# Patient Record
Sex: Female | Born: 1990 | Race: White | Hispanic: No | Marital: Single
Health system: Southern US, Community
[De-identification: ages and names within clinical notes are randomized; demographics above are authoritative.]

---

## 2014-07-30 DIAGNOSIS — F649 Gender identity disorder, unspecified: Secondary | ICD-10-CM | POA: Insufficient documentation

## 2017-07-14 DIAGNOSIS — F410 Panic disorder [episodic paroxysmal anxiety] without agoraphobia: Secondary | ICD-10-CM | POA: Insufficient documentation

## 2018-03-29 DIAGNOSIS — F79 Unspecified intellectual disabilities: Secondary | ICD-10-CM | POA: Insufficient documentation

## 2021-03-24 DIAGNOSIS — R9431 Abnormal electrocardiogram [ECG] [EKG]: Secondary | ICD-10-CM | POA: Insufficient documentation

## 2021-03-24 DIAGNOSIS — R0602 Shortness of breath: Secondary | ICD-10-CM | POA: Insufficient documentation

## 2021-03-24 DIAGNOSIS — R079 Chest pain, unspecified: Secondary | ICD-10-CM | POA: Insufficient documentation

## 2021-03-24 DIAGNOSIS — Z8709 Personal history of other diseases of the respiratory system: Secondary | ICD-10-CM | POA: Insufficient documentation

## 2021-08-07 ENCOUNTER — Ambulatory Visit (INDEPENDENT_AMBULATORY_CARE_PROVIDER_SITE_OTHER): Payer: Commercial Managed Care - PPO

## 2021-08-07 ENCOUNTER — Other Ambulatory Visit: Payer: Self-pay

## 2021-08-07 ENCOUNTER — Other Ambulatory Visit: Payer: Self-pay | Admitting: *Deleted

## 2021-08-07 ENCOUNTER — Ambulatory Visit: Payer: Commercial Managed Care - PPO | Admitting: Podiatry

## 2021-08-07 ENCOUNTER — Encounter: Payer: Self-pay | Admitting: Podiatry

## 2021-08-07 ENCOUNTER — Other Ambulatory Visit: Payer: Self-pay | Admitting: Podiatry

## 2021-08-07 DIAGNOSIS — M25571 Pain in right ankle and joints of right foot: Secondary | ICD-10-CM | POA: Diagnosis not present

## 2021-08-07 DIAGNOSIS — M778 Other enthesopathies, not elsewhere classified: Secondary | ICD-10-CM

## 2021-08-07 DIAGNOSIS — G8929 Other chronic pain: Secondary | ICD-10-CM | POA: Diagnosis not present

## 2021-08-07 DIAGNOSIS — M79671 Pain in right foot: Secondary | ICD-10-CM

## 2021-08-07 DIAGNOSIS — M25371 Other instability, right ankle: Secondary | ICD-10-CM

## 2021-08-12 NOTE — Progress Notes (Signed)
Subjective:   Patient ID: Nancy Levine, female   DOB: 30 y.o.   MRN: [id number]   HPI 30 year old female presents the office with her mom for concerns of right ankle, foot discomfort.  She has not been hurting for the last 2 months.  She has a previous history of having surgery for torn ligament in the ankle and she also states that she had a screw put in that was removed 6 months later as she was not able to tolerate this.  This was done in 2010.  She denies any recent injury or trauma.  She is using a cane due to the pain.   Review of Systems  All other systems reviewed and are negative.  History reviewed. No pertinent past medical history.  History reviewed. No pertinent surgical history.   Current Outpatient Medications:    albuterol (VENTOLIN HFA) 108 (90 Base) MCG/ACT inhaler, SMARTSIG:1-2 Puff(s) Via Inhaler Every 6 Hours PRN, Disp: , Rfl:    estradiol (ESTRACE) 2 MG tablet, Take 2 mg by mouth 2 (two) times daily., Disp: , Rfl:    spironolactone (ALDACTONE) 100 MG tablet, Take 100 mg by mouth 2 (two) times daily., Disp: , Rfl:   No Known Allergies        Objective:  Physical Exam  General: AAO x3, NAD  Dermatological: Skin is warm, dry and supple bilateral.  There are no open sores, no preulcerative lesions, no rash or signs of infection present.  Scar from prior surgery are well-healed.  Vascular: Dorsalis Pedis artery and Posterior Tibial artery pedal pulses are 2/4 bilateral with immedate capillary fill time.  There is no pain with calf compression, swelling, warmth, erythema.   Neruologic: Grossly intact via light touch bilateral.   Musculoskeletal: There is tenderness palpation of the lateral ankle complex mostly in the ATFL.  Slight increase in anterior drawer compared to contra lower extremity.  Mild discomfort on sinus tarsi.  Mild discomfort on the peroneal tendon but overall clinically it appears to be intact.  Muscular strength 5/5 in all groups tested  bilateral.  Gait: Unassisted, Nonantalgic.       Assessment:   Right ankle instability, pain     Plan:  -Treatment options discussed including all alternatives, risks, and complications -Etiology of symptoms were discussed -X-rays were obtained and reviewed with the patient.  No evidence of acute fracture or stress fracture identified today.  Awaiting radiology report. -This point given her history of surgical site instability and pain I recommend an MRI to further evaluate the lateral ankle complex as well as peroneal tendon.  This was ordered today.  This is for potential surgical planning.  Hopefully we can avoid surgery and discussed possibly starting physical therapy however I would like to wait the results of the MRI before proceeding more further treatment.  Vivi Barrack DPM

## 2021-08-13 ENCOUNTER — Telehealth: Payer: Self-pay | Admitting: *Deleted

## 2021-08-13 NOTE — Telephone Encounter (Signed)
Called and spoke with Nancy Levine from [facility] and the prior authorization was not needed for procedure code 49449 and the reference number is [id number] and called Fleet Contras from [facility] and Fleet Contras stated that she would call the patient. Misty Stanley

## 2021-08-23 ENCOUNTER — Ambulatory Visit (INDEPENDENT_AMBULATORY_CARE_PROVIDER_SITE_OTHER): Payer: Commercial Managed Care - PPO

## 2021-08-23 ENCOUNTER — Other Ambulatory Visit: Payer: Self-pay

## 2021-08-23 DIAGNOSIS — M25571 Pain in right ankle and joints of right foot: Secondary | ICD-10-CM

## 2021-08-23 DIAGNOSIS — M25371 Other instability, right ankle: Secondary | ICD-10-CM

## 2021-08-27 ENCOUNTER — Other Ambulatory Visit: Payer: Self-pay | Admitting: Podiatry

## 2021-08-27 DIAGNOSIS — M25371 Other instability, right ankle: Secondary | ICD-10-CM

## 2021-08-28 ENCOUNTER — Telehealth: Payer: Self-pay | Admitting: *Deleted

## 2021-08-28 NOTE — Telephone Encounter (Signed)
Called and spoke with the patient and relayed the message per Dr Wagoner. Tyla Burgner 

## 2021-08-28 NOTE — Telephone Encounter (Signed)
-----   Message from Vivi Barrack, DPM sent at 08/27/2021  7:22 AM EDT ----- Misty Stanley- can you please let her know that the MRI did show likely chronic sprain present to the outside of the ankle. One of the ankle ligaments may have a tear. It also shows some post-surgical changes. I have put in a referral to [facility] at the [facility] in [place]. Thanks.

## 2022-11-01 IMAGING — MR MR ANKLE*R* W/O CM
5 series · 40 of 40 positions shown · non-contrast
Comparison: Ankle radiograph 08/07/2021

CLINICAL DATA: Ankle pain, instability suspected, neg xray

EXAM:
MRI OF THE RIGHT ANKLE WITHOUT CONTRAST
TECHNIQUE: Multiplanar, multisequence MR imaging of the ankle was performed. No
intravenous contrast was administered.

[Series 3: PD fat-sat · axial · 3.0mm · 0.70mm/px · z∈[-116,+19]mm · 9 of 42 slices shown]
[im 1/42]
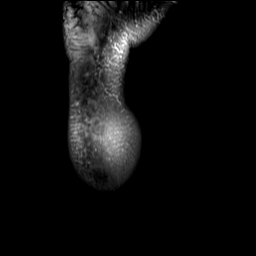
[im 6/42]
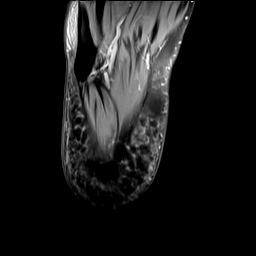
[im 11/42]
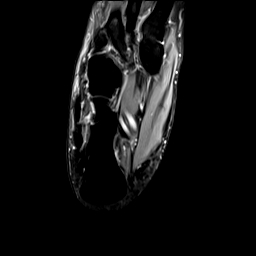
[im 16/42]
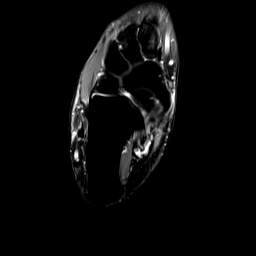
[im 21/42]
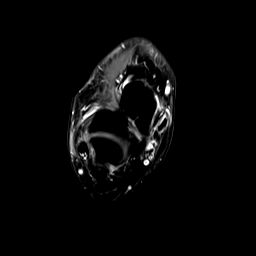
[im 26/42]
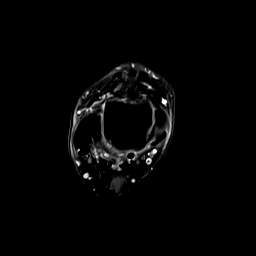
[im 31/42]
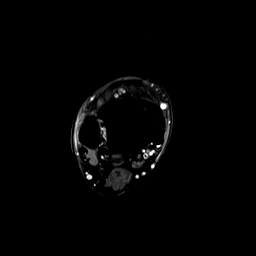
[im 36/42]
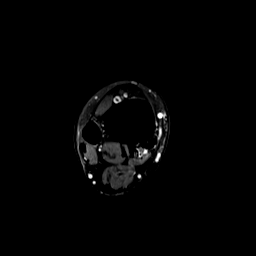
[im 42/42]
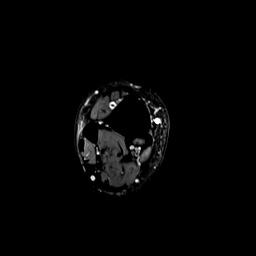

[Series 4: T2 fat-sat · axial · 3.0mm · 0.70mm/px · z∈[-116,+19]mm · 9 of 42 slices shown]
[im 1/42]
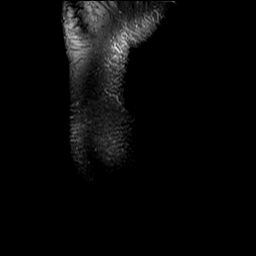
[im 6/42]
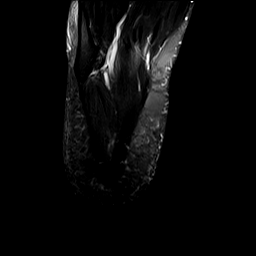
[im 11/42]
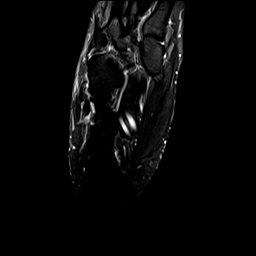
[im 16/42]
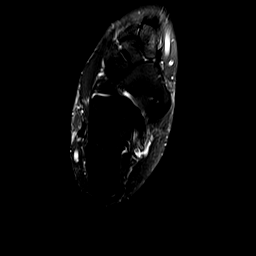
[im 21/42]
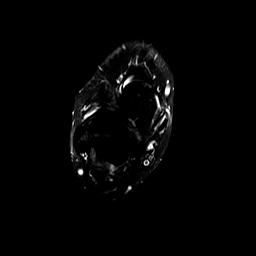
[im 26/42]
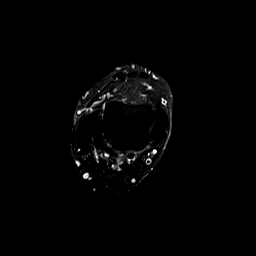
[im 31/42]
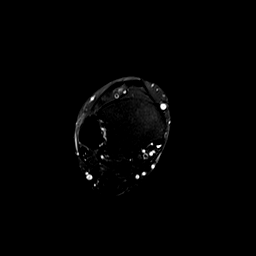
[im 36/42]
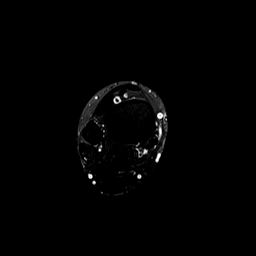
[im 42/42]
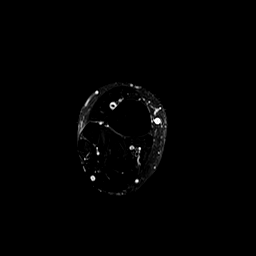

[Series 5: T1 · sagittal · 3.0mm · 0.56mm/px · 6 of 26 slices shown]
[im 1/26]
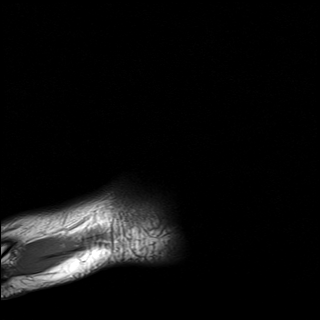
[im 6/26]
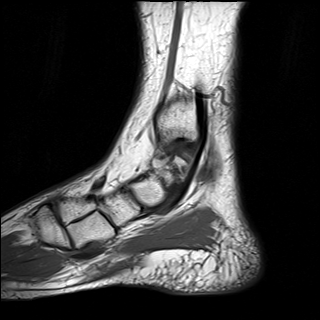
[im 11/26]
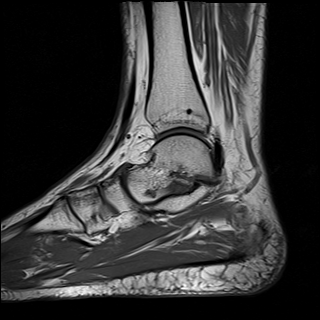
[im 16/26]
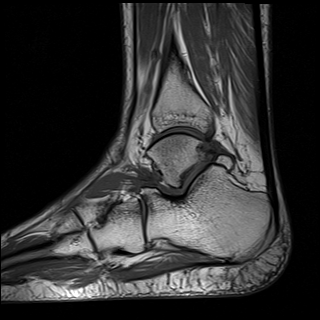
[im 21/26]
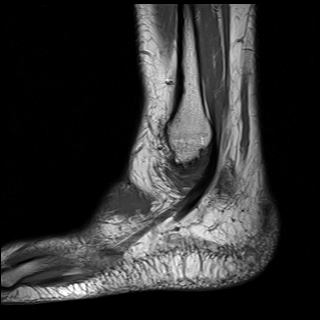
[im 26/26]
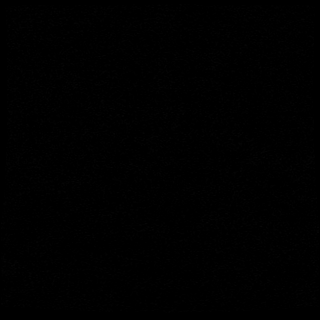

[Series 6: STIR · sagittal · 3.0mm · 0.70mm/px · 6 of 26 slices shown (1 of 2)]
[im 1/26]
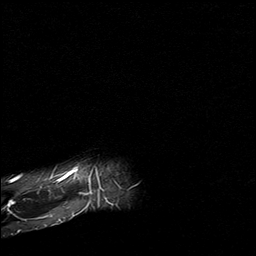
[im 6/26]
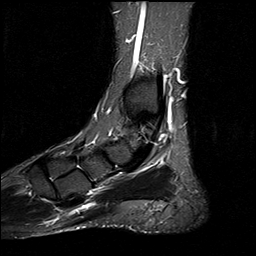
[im 11/26]
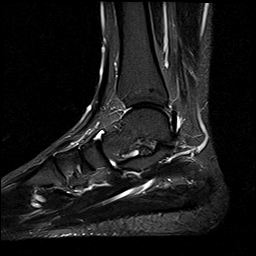
[im 16/26]
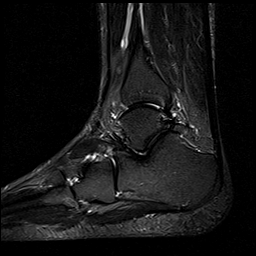
[im 21/26]
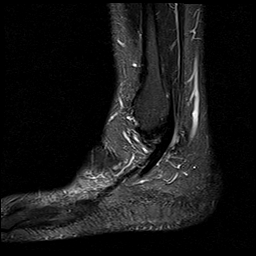
[im 26/26]
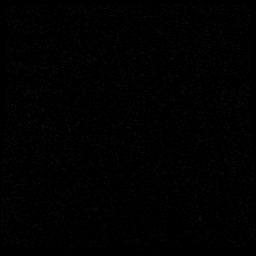

[Series 7: STIR · coronal · 3.0mm · 0.70mm/px · 10 of 45 slices shown (2 of 2)]
[im 1/45]
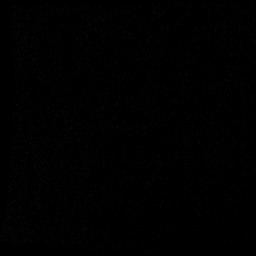
[im 5/45]
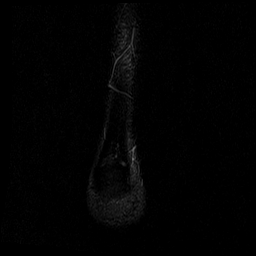
[im 10/45]
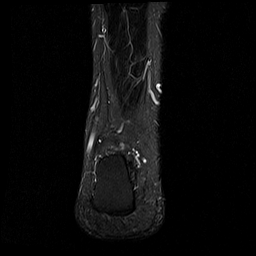
[im 15/45]
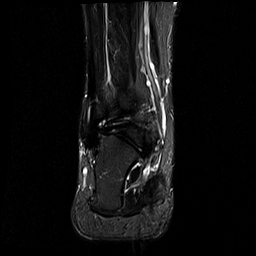
[im 20/45]
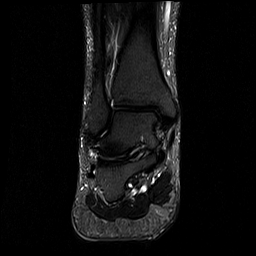
[im 25/45]
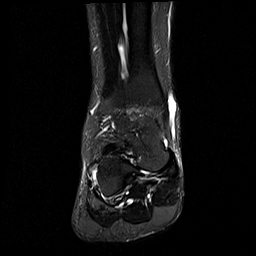
[im 30/45]
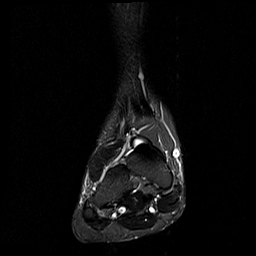
[im 35/45]
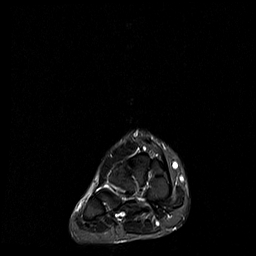
[im 40/45]
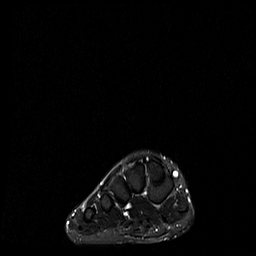
[im 45/45]
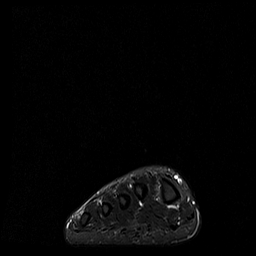

[40 of 40 positions shown; findings below may reference images not displayed]

FINDINGS: TENDONS

Peroneal: Peroneal longus and brevis tendons are intact.

Posteromedial: Mild tenosynovitis of the posterior tibial tendon
above and below the medial malleolus. Flexor digitorum longus tendon
intact. Flexor hallucis longus tendon intact.

Anterior: Tibialis anterior tendon intact. Extensor hallucis longus
tendon intact Extensor digitorum longus tendon intact.

Achilles:  Intact.

Plantar Fascia: Intact.

LIGAMENTS

Lateral: Thickened but intact ATFL, consistent with chronic sprain.
The distal calcaneofibular ligament is not clearly visible, and
without adjacent edema, possibly chronically torn. The PTFL is
thickened with increased, intermediate internal signal resembling
mucoid degeneration, but appears intact. Anterior and posterior
tibiofibular ligaments intact.

Medial: Deltoid ligament intact. Spring ligament intact.

CARTILAGE

Ankle Joint: No joint effusion. Normal ankle mortise. No chondral
defect.

Subtalar Joints/Sinus Tarsi: Normal subtalar joints. No subtalar
joint effusion. Low T1 signal throughout the sinus tarsi, suggestive
of scarring.

Bones: Prominent posterior process of the talus. There is minimal
hindfoot and midfoot spurring. No significant marrow signal
alteration.

Soft Tissue: No fluid collection or hematoma. Muscles are normal
without edema or atrophy. Tarsal tunnel is normal.
IMPRESSION: Thickened but intact ATFL, consistent with a chronic sprain. Poorly
visualized distal aspect of the calcaneofibular ligament, possibly
chronically torn. Thickened PT FL with increased, intermediate
internal signal resembling mucoid degeneration, but appears intact.

Intact peroneal tendons.

Low T1 signal throughout the sinus tarsi suggestive of
scarring/sinus tarsi syndrome.

Mild tenosynovitis of the posterior tibial tendon above and below
the medial malleolus.
